# Patient Record
Sex: Male | Born: 1955 | Race: White | Hispanic: No | Marital: Married | Smoking: Current some day smoker
Health system: Southern US, Community
[De-identification: ages and names within clinical notes are randomized; demographics above are authoritative.]

## PROBLEM LIST (undated history)

## (undated) HISTORY — PX: BACK SURGERY: SHX140

## (undated) HISTORY — PX: TONSILLECTOMY: SUR1361

## (undated) HISTORY — PX: FRACTURE SURGERY: SHX138

## (undated) HISTORY — PX: APPENDECTOMY: SHX54

---

## 2000-08-26 ENCOUNTER — Inpatient Hospital Stay (HOSPITAL_COMMUNITY): Admission: EM | Admit: 2000-08-26 | Discharge: 2000-08-29 | Payer: Self-pay | Admitting: Internal Medicine

## 2000-08-26 ENCOUNTER — Encounter: Payer: Self-pay | Admitting: Internal Medicine

## 2000-08-27 ENCOUNTER — Encounter: Payer: Self-pay | Admitting: Orthopaedic Surgery

## 2001-01-25 ENCOUNTER — Ambulatory Visit (HOSPITAL_BASED_OUTPATIENT_CLINIC_OR_DEPARTMENT_OTHER): Admission: RE | Admit: 2001-01-25 | Discharge: 2001-01-25 | Payer: Self-pay | Admitting: Orthopaedic Surgery

## 2013-04-17 ENCOUNTER — Other Ambulatory Visit: Payer: Self-pay | Admitting: Orthopaedic Surgery

## 2013-04-17 DIAGNOSIS — R52 Pain, unspecified: Secondary | ICD-10-CM

## 2013-04-24 ENCOUNTER — Ambulatory Visit
Admission: RE | Admit: 2013-04-24 | Discharge: 2013-04-24 | Disposition: A | Discharge status: Home / Self Care | Payer: BC Managed Care – PPO | Source: Ambulatory Visit | Attending: Orthopaedic Surgery | Admitting: Orthopaedic Surgery

## 2013-04-24 DIAGNOSIS — R52 Pain, unspecified: Secondary | ICD-10-CM

## 2014-04-01 IMAGING — CT CT ANKLE*R* W/O CM
2 of 4 series · 5 of 14 positions shown, 6 images · IV contrast (agent unspecified)
Comparison: None.

***ADDENDUM*** CREATED: 06/25/2013 [DATE]

Original report incorrectly states CT of the ankle with contrast.
CT OF THE RIGHT ANKLE WITHOUT CONTRAST
TECHNIQUE: Multidetector CT imaging was performed following the
standard protocol without administration of intravenous contrast.
***END ADDENDUM*** SIGNED BY: Samsonidze Bdoyan, M.D.
CLINICAL DATA: Ankle pain following ORIF of the distal fibula.
CT OF THE RIGHT ANKLE WITH CONTRAST
standard protocol during bolus administration of intravenous
contrast.

[Series 2: ankle/foot bone · axial · 0.29mm/px · z∈[+15,+125]mm · 3 of 88 slices shown, 4 images]
[im 22/88  soft-tissue]
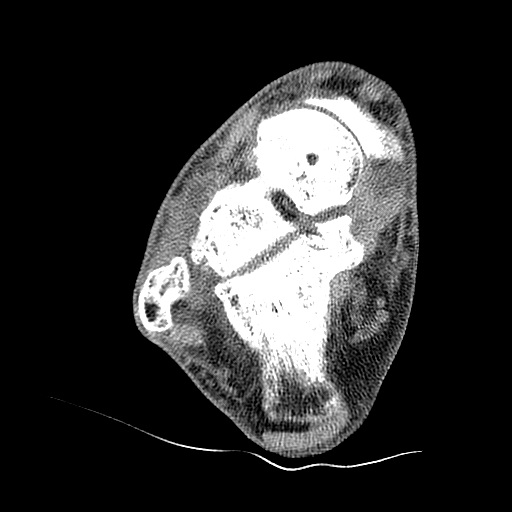
[im 22/88  bone]
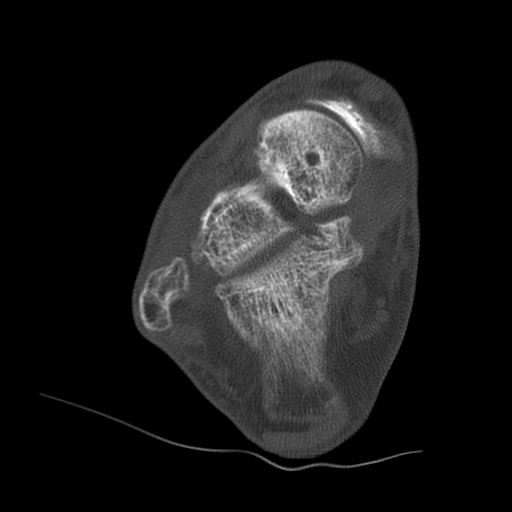
[im 44/88  bone]
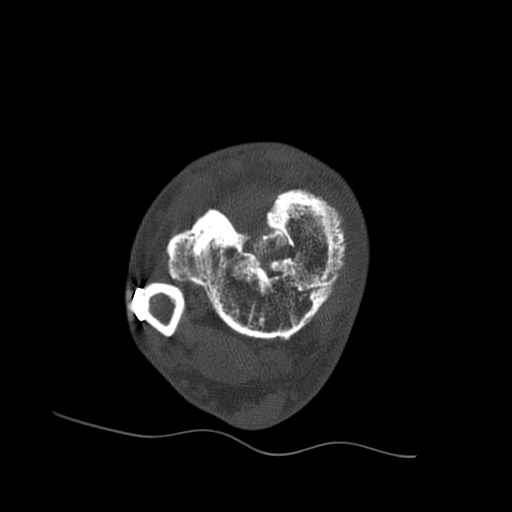
[im 66/88  bone]
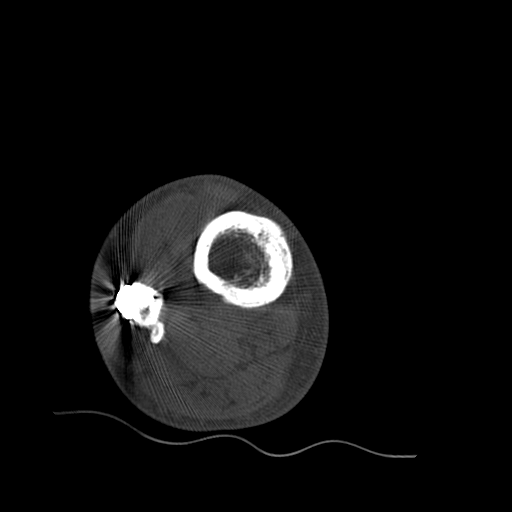

[Series 3: ankle /foot detail · axial · 0.29mm/px · z∈[+35,+108]mm · 2 of 88 slices shown]
[im 30/88  bone]
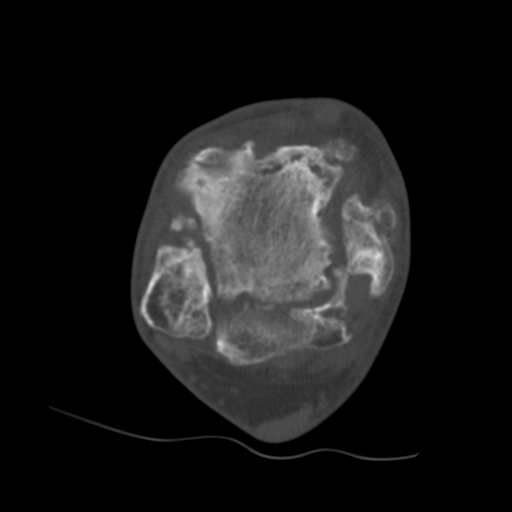
[im 59/88  bone]
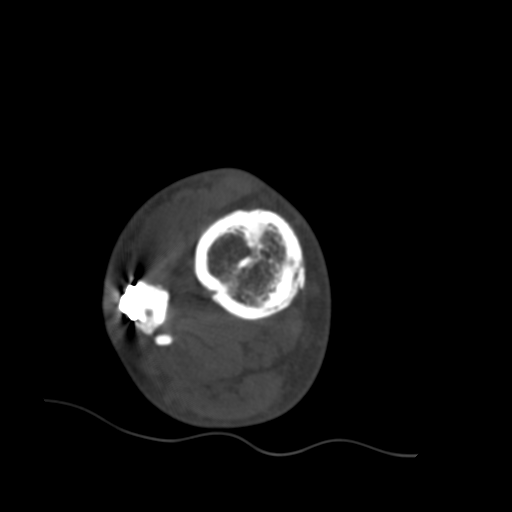

[5 of 14 positions shown; findings below may reference images not displayed]

FINDINGS: There is malunion of the Staden fracture of the distal
tibia.  The large loose bodies are present in the ankle joint.  The
ankle is severely degenerated, with large marginal osteophytes.
Large loose body or os trigonum present in the posterior joint
measuring 1 cm.  Loose bodies in the anterior joint also measure
about 1 cm.  There is a large cleft in the anterior tibial plafond
associated with malunion.  The distal fibular fracture is well
healed and intact.  There is no hardware complication identified.
There is no tibial hardware present.  Diffuse osteopenia is
present, likely disuse.  Pin tracks are present in the talus and
calcaneus.  Subchondral cysts are present throughout the talar
dome.  The subtalar joints are preserved, without spurring or
significant osteoarthritis.  Calcaneocuboid joint and midfoot
joints also appear within normal limits.

The peroneal tendons appear located.  Posteromedial tendons are
located.  There is some calcification and tunneling of the flexor
digitorum and posterior tibial tendons at the level of the medial
malleolus.  Calcification in the overlying retinaculum is
identified on image number 57.  The Achilles tendon appears intact.
Plantar fascia is within normal limits.
IMPRESSION: 1.  Yuniel fracture malunion with severe secondary osteoarthritis of
the ankle.  Multiple loose bodies are present measuring about 1 cm
each.  Sclerosis and subchondral cyst formation in the talar dome
appears degenerative rather than associated with AVN.
2.  Healed distal fibular fracture with intact lateral plate and
screw fixation.
3.  Normal appearance of the subtalar joints and midfoot joints.
4.  Partial tunneling of the posterior tibial tendon and flexor
digitorum longus tendons at the medial malleolus.

## 2016-03-24 ENCOUNTER — Encounter (HOSPITAL_COMMUNITY): Payer: Self-pay | Admitting: Emergency Medicine

## 2016-03-24 ENCOUNTER — Emergency Department (HOSPITAL_COMMUNITY): Payer: BLUE CROSS/BLUE SHIELD

## 2016-03-24 ENCOUNTER — Emergency Department (HOSPITAL_COMMUNITY)
Admission: EM | Admit: 2016-03-24 | Discharge: 2016-03-24 | Disposition: A | Discharge status: Home / Self Care | Payer: BLUE CROSS/BLUE SHIELD | Attending: Emergency Medicine | Admitting: Emergency Medicine

## 2016-03-24 DIAGNOSIS — F17210 Nicotine dependence, cigarettes, uncomplicated: Secondary | ICD-10-CM | POA: Diagnosis not present

## 2016-03-24 DIAGNOSIS — R509 Fever, unspecified: Secondary | ICD-10-CM

## 2016-03-24 DIAGNOSIS — R531 Weakness: Secondary | ICD-10-CM | POA: Diagnosis not present

## 2016-03-24 DIAGNOSIS — L03112 Cellulitis of left axilla: Secondary | ICD-10-CM | POA: Insufficient documentation

## 2016-03-24 LAB — CBC WITH DIFFERENTIAL/PLATELET
Basophils Absolute: 0 10*3/uL (ref 0.0–0.1)
Basophils Relative: 0 %
EOS ABS: 0 10*3/uL (ref 0.0–0.7)
Eosinophils Relative: 0 %
HCT: 43.1 % (ref 39.0–52.0)
HEMOGLOBIN: 14.8 g/dL (ref 13.0–17.0)
LYMPHS ABS: 1.2 10*3/uL (ref 0.7–4.0)
LYMPHS PCT: 15 %
MCH: 33 pg (ref 26.0–34.0)
MCHC: 34.3 g/dL (ref 30.0–36.0)
MCV: 96 fL (ref 78.0–100.0)
MONOS PCT: 7 %
Monocytes Absolute: 0.6 10*3/uL (ref 0.1–1.0)
NEUTROS PCT: 78 %
Neutro Abs: 6.1 10*3/uL (ref 1.7–7.7)
Platelets: 245 10*3/uL (ref 150–400)
RBC: 4.49 MIL/uL (ref 4.22–5.81)
RDW: 12.8 % (ref 11.5–15.5)
WBC: 8 10*3/uL (ref 4.0–10.5)

## 2016-03-24 LAB — URINALYSIS, ROUTINE W REFLEX MICROSCOPIC
Bilirubin Urine: NEGATIVE
GLUCOSE, UA: NEGATIVE mg/dL
HGB URINE DIPSTICK: NEGATIVE
Ketones, ur: 15 mg/dL — AB
LEUKOCYTES UA: NEGATIVE
Nitrite: NEGATIVE
PH: 6 (ref 5.0–8.0)
PROTEIN: NEGATIVE mg/dL
SPECIFIC GRAVITY, URINE: 1.021 (ref 1.005–1.030)

## 2016-03-24 LAB — I-STAT CG4 LACTIC ACID, ED: LACTIC ACID, VENOUS: 1.65 mmol/L (ref 0.5–2.0)

## 2016-03-24 LAB — BASIC METABOLIC PANEL
Anion gap: 10 (ref 5–15)
BUN: 14 mg/dL (ref 6–20)
CHLORIDE: 99 mmol/L — AB (ref 101–111)
CO2: 25 mmol/L (ref 22–32)
CREATININE: 1.07 mg/dL (ref 0.61–1.24)
Calcium: 9.1 mg/dL (ref 8.9–10.3)
GFR calc Af Amer: >60 mL/min (ref >60)
GFR calc non Af Amer: >60 mL/min (ref >60)
GLUCOSE: 116 mg/dL — AB (ref 65–99)
POTASSIUM: 4.6 mmol/L (ref 3.5–5.1)
SODIUM: 134 mmol/L — AB (ref 135–145)

## 2016-03-24 MED ORDER — ACETAMINOPHEN 325 MG PO TABS
650.0000 mg | ORAL_TABLET | Freq: Once | ORAL | Status: AC
  Administered 2016-03-24: 650 mg via ORAL
  Filled 2016-03-24: qty 2

## 2016-03-24 MED ORDER — SODIUM CHLORIDE 0.9 % IV BOLUS (SEPSIS)
1000.0000 mL | Freq: Once | INTRAVENOUS | Status: AC
  Administered 2016-03-24: 1000 mL via INTRAVENOUS

## 2016-03-24 MED ORDER — DEXTROSE 5 % IV SOLN
1.0000 g | Freq: Once | INTRAVENOUS | Status: AC
  Administered 2016-03-24: 1 g via INTRAVENOUS
  Filled 2016-03-24: qty 10

## 2016-03-24 MED ORDER — ONDANSETRON HCL 4 MG/2ML IJ SOLN
4.0000 mg | Freq: Once | INTRAMUSCULAR | Status: AC
  Administered 2016-03-24: 4 mg via INTRAVENOUS
  Filled 2016-03-24: qty 2

## 2016-03-24 MED ORDER — ONDANSETRON HCL 4 MG/2ML IJ SOLN
4.0000 mg | Freq: Once | INTRAMUSCULAR | Status: DC
  Filled 2016-03-24: qty 2

## 2016-03-24 MED ORDER — KETOROLAC TROMETHAMINE 30 MG/ML IJ SOLN
30.0000 mg | Freq: Once | INTRAMUSCULAR | Status: AC
Start: 2016-03-24 — End: 2016-03-24
  Administered 2016-03-24: 30 mg via INTRAVENOUS
  Filled 2016-03-24: qty 1

## 2016-03-24 MED ORDER — DOXYCYCLINE HYCLATE 100 MG PO TABS
100.0000 mg | ORAL_TABLET | Freq: Once | ORAL | Status: AC
  Administered 2016-03-24: 100 mg via ORAL
  Filled 2016-03-24: qty 1

## 2016-03-24 MED ORDER — DOXYCYCLINE HYCLATE 100 MG PO CAPS: 100.0000 mg | ORAL_CAPSULE | Freq: Two times a day (BID) | ORAL | Status: AC

## 2016-03-24 MED ORDER — CEFAZOLIN SODIUM-DEXTROSE 2-4 GM/100ML-% IV SOLN
2.0000 g | Freq: Once | INTRAVENOUS | Status: DC
Start: 2016-03-24 — End: 2016-03-24
  Filled 2016-03-24: qty 100

## 2016-03-24 MED ORDER — MORPHINE SULFATE (PF) 4 MG/ML IV SOLN
4.0000 mg | Freq: Once | INTRAVENOUS | Status: AC
  Administered 2016-03-24: 4 mg via INTRAVENOUS
  Filled 2016-03-24: qty 1

## 2016-03-24 MED ORDER — HYDROCODONE-ACETAMINOPHEN 5-325 MG PO TABS
1.0000 | ORAL_TABLET | Freq: Once | ORAL | Status: AC
  Administered 2016-03-24: 1 via ORAL
  Filled 2016-03-24: qty 1

## 2016-03-24 MED ORDER — MORPHINE SULFATE (PF) 4 MG/ML IV SOLN
4.0000 mg | INTRAVENOUS | Status: DC | PRN
Start: 2016-03-24 — End: 2016-03-24
  Administered 2016-03-24: 4 mg via INTRAVENOUS
  Filled 2016-03-24 (×2): qty 1

## 2016-03-24 MED ORDER — HYDROCODONE-ACETAMINOPHEN 5-325 MG PO TABS: 2.0000 | ORAL_TABLET | ORAL | Status: AC | PRN

## 2016-03-24 NOTE — Discharge Instructions (Signed)
Doxycycline can cause skin sensitivity. Use sunscreen liberally. Return to ER with any new or worsening symptoms.   Cellulitis Cellulitis is an infection of the skin and the tissue under the skin. The infected area is usually red and tender. This happens most often in the arms and lower legs. HOME CARE   Take your antibiotic medicine as told. Finish the medicine even if you start to feel better.  Keep the infected arm or leg raised (elevated).  Put a warm cloth on the area up to 4 times per day.  Only take medicines as told by your doctor.  Keep all doctor visits as told. GET HELP IF:  You see red streaks on the skin coming from the infected area.  Your red area gets bigger or turns a dark color.  Your bone or joint under the infected area is painful after the skin heals.  Your infection comes back in the same area or different area.  You have a puffy (swollen) bump in the infected area.  You have new symptoms.  You have a fever. GET HELP RIGHT AWAY IF:   You feel very sleepy.  You throw up (vomit) or have watery poop (diarrhea).  You feel sick and have muscle aches and pains.   This information is not intended to replace advice given to you by your health care provider. Make sure you discuss any questions you have with your health care provider.   Document Released: 03/14/2008 Document Revised: 06/17/2015 Document Reviewed: 12/12/2011 Elsevier Interactive Patient Education 2016 Elsevier Inc.  Fever, Adult A fever is an increase in the body's temperature. It is often defined as a temperature of 100 F (38C) or higher. Short mild or moderate fevers often have no long-term effects. They also often do not need treatment. Moderate or high fevers may make you feel uncomfortable. Sometimes, they can also be a sign of a serious illness or disease. The sweating that may happen with repeated fevers or fevers that last a while may also cause you to not have enough fluid in your  body (dehydration). You can take your temperature with a thermometer to see if you have a fever. A measured temperature can change with:  Age.  Time of day.  Where the thermometer is placed:  Mouth (oral).  Rectum (rectal).  Ear (tympanic).  Underarm (axillary).  Forehead (temporal). HOME CARE Pay attention to any changes in your symptoms. Take these actions to help with your condition:  Take over-the-counter and prescription medicines only as told by your doctor. Follow the dosing instructions carefully.  If you were prescribed an antibiotic medicine, take it as told by your doctor. Do not stop taking the antibiotic even if you start to feel better.  Rest as needed.  Drink enough fluid to keep your pee (urine) clear or pale yellow.  Sponge yourself or bathe with room-temperature water as needed. This helps to lower your body temperature . Do not use ice water.  Do not wear too many blankets or heavy clothes. GET HELP IF:  You throw up (vomit).  You cannot eat or drink without throwing up.  You have watery poop (diarrhea).  It hurts when you pee.  Your symptoms do not get better with treatment.  You have new symptoms.  You feel very weak. GET HELP RIGHT AWAY IF:  You are short of breath or have trouble breathing.  You are dizzy or you pass out (faint).  You feel confused.  You have signs of not  having enough fluid in your body, such as:  A dry mouth.  Peeing less.  Looking pale.  You have very bad pain in your belly (abdomen).  You keep throwing up or having water poop.  You have a skin rash.  Your symptoms suddenly get worse.   This information is not intended to replace advice given to you by your health care provider. Make sure you discuss any questions you have with your health care provider.   Document Released: 07/05/2008 Document Revised: 06/17/2015 Document Reviewed: 11/20/2014 Elsevier Interactive Patient Education [employer]2016 Elsevier  Inc.

## 2016-03-24 NOTE — ED Notes (Signed)
MD at bedside. 

## 2016-03-24 NOTE — ED Notes (Signed)
PT DISCHARGED. INSTRUCTIONS AND PRESCRIPTIONS GIVEN. AAOX4. PT IN NO APPARENT DISTRESS. THE OPPORTUNITY TO ASK QUESTIONS WAS PROVIDED. 

## 2016-03-24 NOTE — ED Notes (Signed)
Nurse at bedside collecting labs 

## 2016-03-24 NOTE — Progress Notes (Signed)
[facility] ED CM [facility] & noted pt with coverage but no pcp listed [facility] ED CM spoke with pt on how to obtain an in network pcp with insurance coverage via the customer service number or web site  Cm reviewed ED level of care for crisis/emergent services and community pcp level of care to manage continuous or chronic medical concerns.  The pt voiced understanding CM encouraged pt and discussed pt's responsibility to verify with pt's insurance carrier that any recommended medical provider offered by any emergency room or a hospital provider is within the carrier's network. The pt voiced understanding  Pt also inquired about paying ED to meet his deductible Cm informed him if ED registration did not ask for a co pay he would be billed and then could pay the bill to meet his deductible

## 2016-03-24 NOTE — ED Notes (Signed)
Patient sent from urgent care for fever and possible insect bite.  Patient states that symptoms started late Monday night.  He has chills ad sweats and hasn't been able to sleep in 3 days. Patient states was in his shed and believes that is where he got bit by spider.

## 2016-03-24 NOTE — ED Notes (Signed)
Patient transported to X-ray 

## 2016-03-24 NOTE — ED Provider Notes (Signed)
CSN: [id number]     Arrival date & time 03/24/16  [id number] History   First MD Initiated Contact with Patient 03/24/16 [phone]     Chief Complaint  Patient presents with  . Fever  . Chills  . Insect Bite     HPI  Patient presents for evaluation of fever headache and a rash.  3 days ago he was in a shed preparing some fishing equipment for a planned trip to [place]. He does not recall any particular bites. No visualized spiders. No ticks found on his person, that day, or anytime recently.  Later that evening he developed fever shakes and chills. The next day he noticed a small area of redness on his left upper lateral chest that has progressed and gotten larger in size that is red and itches. He has had fever shakes and chills. Takes aspirin at home. He is "having episodes of sweating". He went to primary care urgent care today and was referred here.  He has a headache. Does not have neck pain or stiffness. No sore throat or pharyngitis. No ear pain or sinus symptoms. Again no rigidity to the neck. No cough or difficult to breathing. No GI complaints or pain. No joint pain. Area of erythema approximately 6 cm diameter left lateral chest  History reviewed. No pertinent past medical history. Past Surgical History  Procedure Laterality Date  . Appendectomy    . Tonsillectomy    . Fracture surgery    . Back surgery     No family history on file. Social History  Substance Use Topics  . Smoking status: Current Every Day Smoker    Types: Cigarettes  . Smokeless tobacco: None  . Alcohol Use: Yes    Review of Systems  Constitutional: Positive for fever, chills and diaphoresis. Negative for appetite change and fatigue.  HENT: Negative for mouth sores, sore throat and trouble swallowing.   Eyes: Negative for visual disturbance.  Respiratory: Negative for cough, chest tightness, shortness of breath and wheezing.   Cardiovascular: Negative for chest pain.  Gastrointestinal:  Negative for nausea, vomiting, abdominal pain, diarrhea and abdominal distention.  Endocrine: Negative for polydipsia, polyphagia and polyuria.  Genitourinary: Negative for dysuria, frequency and hematuria.  Musculoskeletal: Negative for gait problem.  Skin: Positive for rash. Negative for color change and pallor.  Neurological: Positive for weakness and headaches. Negative for dizziness, syncope and light-headedness.  Hematological: Does not bruise/bleed easily.  Psychiatric/Behavioral: Negative for behavioral problems and confusion.      Allergies  Review of patient's allergies indicates no known allergies.  Home Medications   Prior to Admission medications   Medication Sig Start Date End Date Taking? Authorizing Provider  aspirin EC 81 MG tablet Take 81 mg by mouth daily.   Yes Historical Provider, MD  Multiple Vitamin (MULTIVITAMIN WITH MINERALS) TABS tablet Take 1 tablet by mouth daily.   Yes Historical Provider, MD  doxycycline (VIBRAMYCIN) 100 MG capsule Take 1 capsule (100 mg total) by mouth 2 (two) times daily. 03/24/16   Rolland PorterMark Aliene Tamura, MD  HYDROcodone-acetaminophen (NORCO/VICODIN) 5-325 MG tablet Take 2 tablets by mouth every 4 (four) hours as needed. 03/24/16   Rolland PorterMark Dewain Platz, MD   BP 137/79 mmHg  Pulse 90  Temp(Src) 100.3 F (37.9 C) (Oral)  Resp 15  SpO2 100% Physical Exam  Constitutional: He is oriented to person, place, and time. He appears well-developed and well-nourished. No distress.  Awake and alert. Mentating well. Does not appear distressed.  HENT:  Head: Normocephalic.  Eyes: Conjunctivae are normal. Pupils are equal, round, and reactive to light. No scleral icterus.  Neck: Normal range of motion. Neck supple. No thyromegaly present.  Cardiovascular: Normal rate and regular rhythm.  Exam reveals no gallop and no friction rub.   No murmur heard. Pulmonary/Chest: Effort normal and breath sounds normal. No respiratory distress. He has no wheezes. He has no rales.     Clear bilateral breath sounds.  Abdominal: Soft. Bowel sounds are normal. He exhibits no distension. There is no tenderness. There is no rebound.  Musculoskeletal: Normal range of motion.  Neurological: He is alert and oriented to person, place, and time.  Skin: Skin is warm and dry. No rash noted.  Psychiatric: He has a normal mood and affect. His behavior is normal.    ED Course  Procedures (including critical care time) Labs Review Labs Reviewed  BASIC METABOLIC PANEL - Abnormal; Notable for the following:    Sodium 134 (*)    Chloride 99 (*)    Glucose, Bld 116 (*)    All other components within normal limits  URINALYSIS, ROUTINE W REFLEX MICROSCOPIC (NOT AT [facility]) - Abnormal; Notable for the following:    Ketones, ur 15 (*)    All other components within normal limits  CULTURE, BLOOD (ROUTINE X 2)  CULTURE, BLOOD (ROUTINE X 2)  CBC WITH DIFFERENTIAL/PLATELET  B. BURGDORFI ANTIBODIES  LYME DISEASE DNA BY PCR(BORRELIA BURG)  I-STAT CG4 LACTIC ACID, ED    Imaging Review Dg Chest 2 View  03/24/2016  CLINICAL DATA:  Spider bite on Tuesday, on Wednesday developed high fever, headache, and nausea which continues EXAM: CHEST  2 VIEW COMPARISON:  None FINDINGS: Normal heart size, mediastinal contours, and pulmonary vascularity. Lungs clear. No pleural effusion or pneumothorax. Mild scattered degenerative disc disease changes mid thoracic spine. IMPRESSION: No acute abnormalities. Electronically Signed   By: Ulyses Southward M.D.   On: 03/24/2016 11:16   I have personally reviewed and evaluated these images and lab results as part of my medical decision-making.   EKG Interpretation None      MDM   Final diagnoses:  Fever, unspecified fever cause  Cellulitis of left axilla    Patient does not have rash with stigmata of erythema chronicum migrans/limes. Does not have palmar rashes suggests rickettsia. May be simple cellulitis. Given IV Rocephin. We'll discharge with  doxycycline. Cautioned against the sun sensitivity effects of doxycycline. Return to ER with any new or worsening symptoms.    Rolland Porter, MD 03/24/16 1314

## 2016-03-25 LAB — LYME DISEASE DNA BY PCR(BORRELIA BURG): LYME DISEASE(B. BURGDORFERI) PCR: POSITIVE — AB

## 2016-03-25 LAB — B. BURGDORFI ANTIBODIES: B burgdorferi Ab IgG+IgM: <0.91 {ISR} (ref 0.00–0.90)

## 2016-03-29 LAB — CULTURE, BLOOD (ROUTINE X 2)
CULTURE: NO GROWTH
CULTURE: NO GROWTH

## 2018-05-21 ENCOUNTER — Encounter (HOSPITAL_COMMUNITY): Payer: Self-pay | Admitting: *Deleted

## 2018-05-21 ENCOUNTER — Emergency Department (HOSPITAL_COMMUNITY)
Admission: EM | Admit: 2018-05-21 | Discharge: 2018-05-21 | Disposition: A | Discharge status: Home / Self Care | Payer: BLUE CROSS/BLUE SHIELD | Attending: Emergency Medicine | Admitting: Emergency Medicine

## 2018-05-21 ENCOUNTER — Other Ambulatory Visit: Payer: Self-pay

## 2018-05-21 ENCOUNTER — Emergency Department (HOSPITAL_COMMUNITY): Payer: BLUE CROSS/BLUE SHIELD

## 2018-05-21 DIAGNOSIS — M79641 Pain in right hand: Secondary | ICD-10-CM | POA: Diagnosis present

## 2018-05-21 DIAGNOSIS — F17210 Nicotine dependence, cigarettes, uncomplicated: Secondary | ICD-10-CM | POA: Insufficient documentation

## 2018-05-21 MED ORDER — AMOXICILLIN-POT CLAVULANATE 875-125 MG PO TABS: 1.0000 | ORAL_TABLET | Freq: Two times a day (BID) | ORAL | 0 refills | Status: AC

## 2018-05-21 NOTE — Discharge Instructions (Signed)
You can take Tylenol or Ibuprofen as directed for pain. You can alternate Tylenol and Ibuprofen every 4 hours. If you take Tylenol at 1pm, then you can take Ibuprofen at 5pm. Then you can take Tylenol again at 9pm.   Continue elevating and applying ice.  Take antibiotics as directed. Please take all of your antibiotics until finished.  He will follow-up with Dr. Ronie SpiesWeingold's office on 05/24/2018.  His office will contact you with a specific time.  Return emergency department for any fevers, worsening redness or swelling or any other worsening or concerning symptoms.

## 2018-05-21 NOTE — ED Notes (Signed)
Pt updated on plan of care by PA

## 2018-05-21 NOTE — ED Notes (Signed)
Pt updated on plan of care

## 2018-05-21 NOTE — ED Provider Notes (Signed)
[facility]-EMERGENCY DEPT Provider Note   CSN: [id number] Arrival date & time: 05/21/18  1328     History   Chief Complaint Chief Complaint  Patient presents with  . Hand Pain    right index finger    HPI Sergio Escobar is a 62 y.o. male who presents for evaluation of right index finger x4 days.  Patient states that prior to onset of symptoms, he was out working in the garden.  He did have gloves on.  Denies any trauma, injury, fall.  He states he woke up the next morning and the finger was swollen and painful.  He reports more pain when trying to flex the finger.  He also noticed that swelling and redness started extending toward the MCP joint.  Patient reports he has tried elevating, icing the finger with minimal improvement.  Patient states he was also concerned about some bruising to the lateral aspect of the finger.  He states that several years ago, he had a foreign body that was lodged in the finger and he never had it removed.  Patient denies any other trauma, injury, fall.  Patient denies any numbness/weakness, fevers.  The history is provided by the patient.    History reviewed. No pertinent past medical history.  There are no active problems to display for this patient.   Past Surgical History:  Procedure Laterality Date  . APPENDECTOMY    . BACK SURGERY    . FRACTURE SURGERY    . TONSILLECTOMY          Home Medications    Prior to Admission medications   Medication Sig Start Date End Date Taking? Authorizing Provider  amoxicillin-clavulanate (AUGMENTIN) 875-125 MG tablet Take 1 tablet by mouth every 12 (twelve) hours. 05/21/18   Maxwell CaulLayden, Sherrod Toothman A, PA-C  aspirin EC 81 MG tablet Take 81 mg by mouth daily.    [provider]  doxycycline (VIBRAMYCIN) 100 MG capsule Take 1 capsule (100 mg total) by mouth 2 (two) times daily. 03/24/16   Rolland PorterJames, Mark, MD  HYDROcodone-acetaminophen (NORCO/VICODIN) 5-325 MG tablet Take 2 tablets by mouth  every 4 (four) hours as needed. 03/24/16   Rolland PorterJames, Mark, MD  Multiple Vitamin (MULTIVITAMIN WITH MINERALS) TABS tablet Take 1 tablet by mouth daily.    [provider]    Family History No family history on file.  Social History Social History   Tobacco Use  . Smoking status: Current Some Day Smoker    Types: Cigarettes  . Smokeless tobacco: Never Used  Substance Use Topics  . Alcohol use: Yes    Alcohol/week: 6.0 standard drinks    Types: 6 Standard drinks or equivalent per week  . Drug use: Yes    Types: Marijuana     Allergies   Oxycodone   Review of Systems Review of Systems  Constitutional: Negative for fever.  Musculoskeletal:       Right index finger pain  Skin: Positive for color change.     Physical Exam Updated Vital Signs BP (!) 144/89 (BP Location: Right Arm)   Pulse (!) 55   Temp 98.7 F (37.1 C) (Oral)   Resp 18   Ht 6' (1.829 m)   Wt 76.8 kg   SpO2 100%   BMI 22.97 kg/m   Physical Exam  Constitutional: He appears well-developed and well-nourished.  HENT:  Head: Normocephalic and atraumatic.  Eyes: Conjunctivae and EOM are normal. Right eye exhibits no discharge. Left eye exhibits no discharge. No  scleral icterus.  Cardiovascular:  Pulses:      Radial pulses are 2+ on the right side, and 2+ on the left side.  Pulmonary/Chest: Effort normal.  Musculoskeletal:  Tenderness to palpation noted to the lateral aspect of the right index finger that extends around to the PIP.  There is some overlying edema, erythema from the PIP that extends distally toward the MCP.  Palpable foreign body noted to the lateral aspect of the finger.  There is some ecchymosis noted to the lateral aspect.  No tenderness noted along the flexor tendon sheath.  Extension intact without any difficulty.  Patient does report some pain with flexion of the digit.  Neurological: He is alert.  Skin: Skin is warm and dry. Capillary refill takes less than 2 seconds.  Good  distal cap refill. RUE is not dusky in appearance or cool to touch.  No open sores, lesions, lacerations.  Psychiatric: He has a normal mood and affect. His speech is normal and behavior is normal.  Nursing note and vitals reviewed.          ED Treatments / Results  Labs (all labs ordered are listed, but only abnormal results are displayed) Labs Reviewed - No data to display  EKG None  Radiology Dg Hand Complete Right  Result Date: 05/21/2018 CLINICAL DATA:  Swelling of the right index finger. Patient has known magnetic object in the right index finger. EXAM: RIGHT HAND - COMPLETE 3+ VIEW COMPARISON:  None. FINDINGS: There is no evidence of fracture or dislocation. Degenerative joint changes of the carpal bones are identified. Radiopaque foreign bodies identified in the soft tissues of the index thickening are consistent with patient's known history. IMPRESSION: No acute abnormality noted. Electronically Signed   By: Sherian Rein M.D.   On: 05/21/2018 14:11    Procedures Procedures (including critical care time)  Medications Ordered in ED Medications - No data to display   Initial Impression / Assessment and Plan / ED Course  I have reviewed the triage vital signs and the nursing notes.  Pertinent labs & imaging results that were available during my care of the patient were reviewed by me and considered in my medical decision making (see chart for details).     62 year old male who presents for evaluation of right index finger pain, redness, swelling that has been ongoing for the last 3 to 4 days.  He does report that prior to onset of symptoms, he had been working in his garden.  Does not recall specific trauma, injury, fall.  He does report that he has had a metal foreign body noted in the finger for several years.  He is worried that this may have shifted in the fingers causing injury.  No fevers, numbness/weakness. Patient is afebrile, non-toxic appearing, sitting  comfortably on examination table. Vital signs reviewed and stable. Patient is neurovascularly intact.  Patient with tenderness palpation noted to the distal aspect of the finger that extends proximally over the MCP joint.  He also has some overlying edema and erythema.  Extension is intact but he has limited flexion secondary to pain.  No overlying warmth.  History/physical exam is not concerning for flexor tenosynovitis.  No obvious open sore, lesion.  X-ray ordered at triage.  X-ray reviewed.  Negative for any acute fracture dislocation.  There is evidence of a metal foreign body noted.  No other acute abnormality.  Discussed patient with Dr. Madilyn Hook after independent evaluation.  Question if this is infection or inflammatory response  from metallic foreign body.  We will plan to consult hand for further evaluation.  Discussed with Dr. Ronie SpiesWeingold's nurse, Sergio Escobar.  She discussed with Dr. Andi Escobar regarding case and he looked over patient's chart.  Recommend starting patient on Augmentin.  He will plan to see patient as outpatient office on 05/24/2018.  Updated patient on plan.  He is agreeable. Patient had ample opportunity for questions and discussion. All patient's questions were answered with full understanding. Strict return precautions discussed. Patient expresses understanding and agreement to plan.   Final Clinical Impressions(s) / ED Diagnoses   Final diagnoses:  Pain of right hand    ED Discharge Orders         Ordered    amoxicillin-clavulanate (AUGMENTIN) 875-125 MG tablet  Every 12 hours     05/21/18 1623           Rosana HoesLayden, Latorria Zeoli A, PA-C 05/21/18 1630    Tilden Fossaees, Elizabeth, MD 05/23/18 1342

## 2018-05-21 NOTE — ED Triage Notes (Signed)
Pt states he was pulling poison ivy vines in his yard on Thursday and then pt woke up on Friday, pt right index finger was swollen.  The pain and swelling has gotten worse despite pt's effort to ice the finger, elevate it, and compress it. Pt a/o x 4 and ambulatory. Pt denies any other new symptoms.

## 2019-04-28 IMAGING — CR DG HAND COMPLETE 3+V*R*
3 series · 3 of 3 positions shown · non-contrast
Comparison: None.

CLINICAL DATA: Swelling of the right index finger. Patient has
known magnetic object in the right index finger.

EXAM:
RIGHT HAND - COMPLETE 3+ VIEW

[x hand pa right]
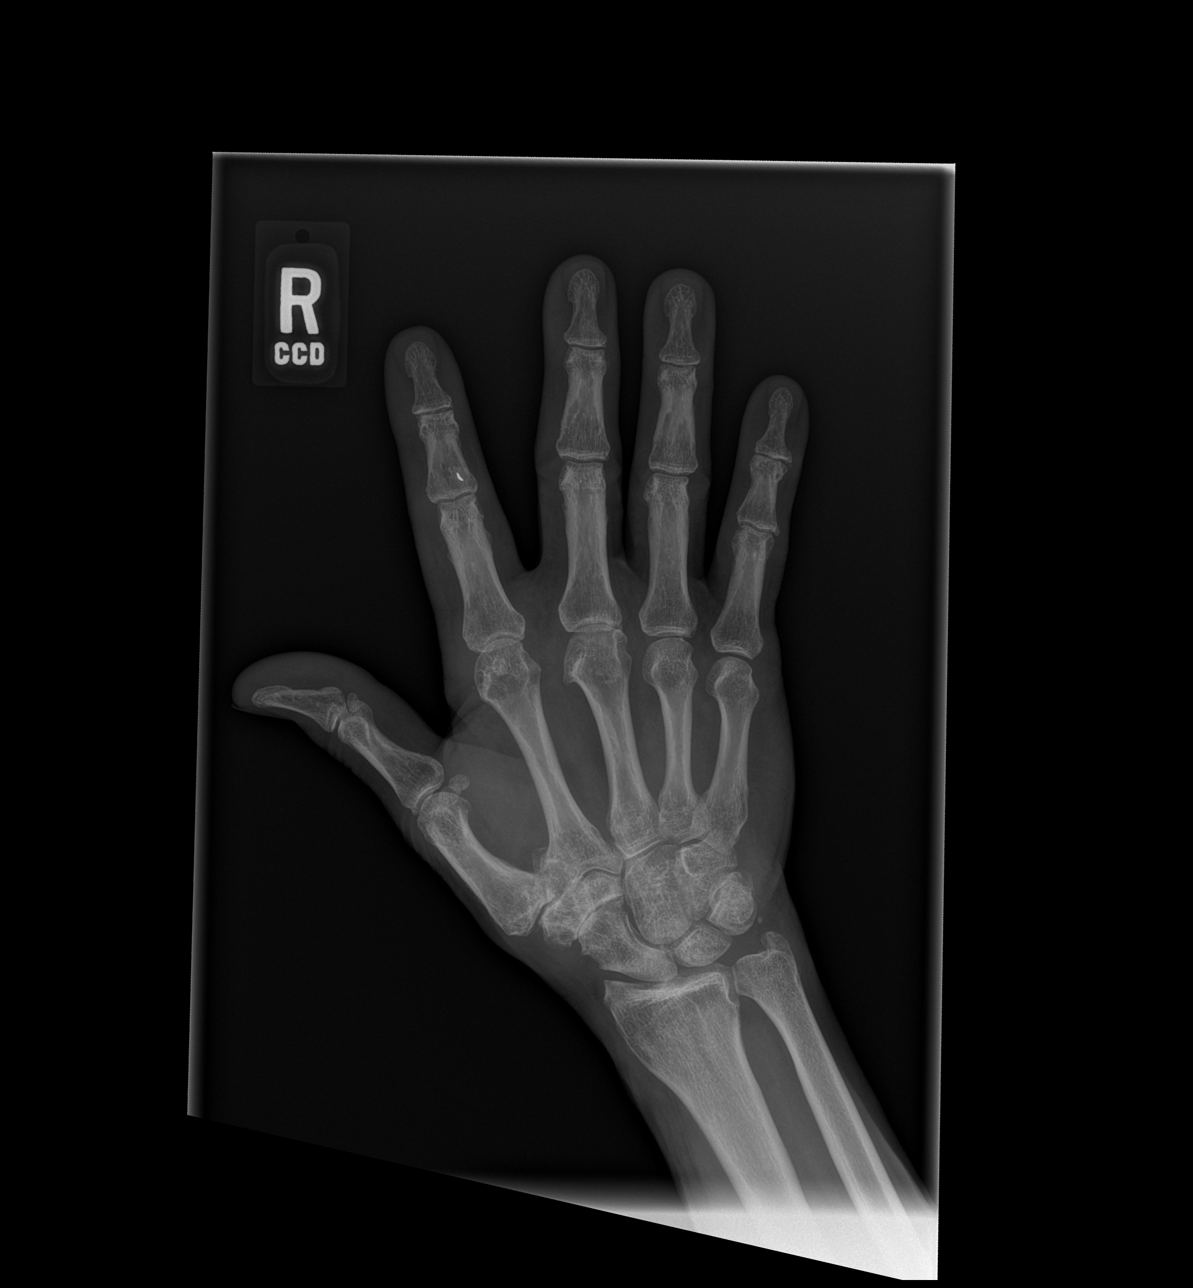

[x hand obl right]
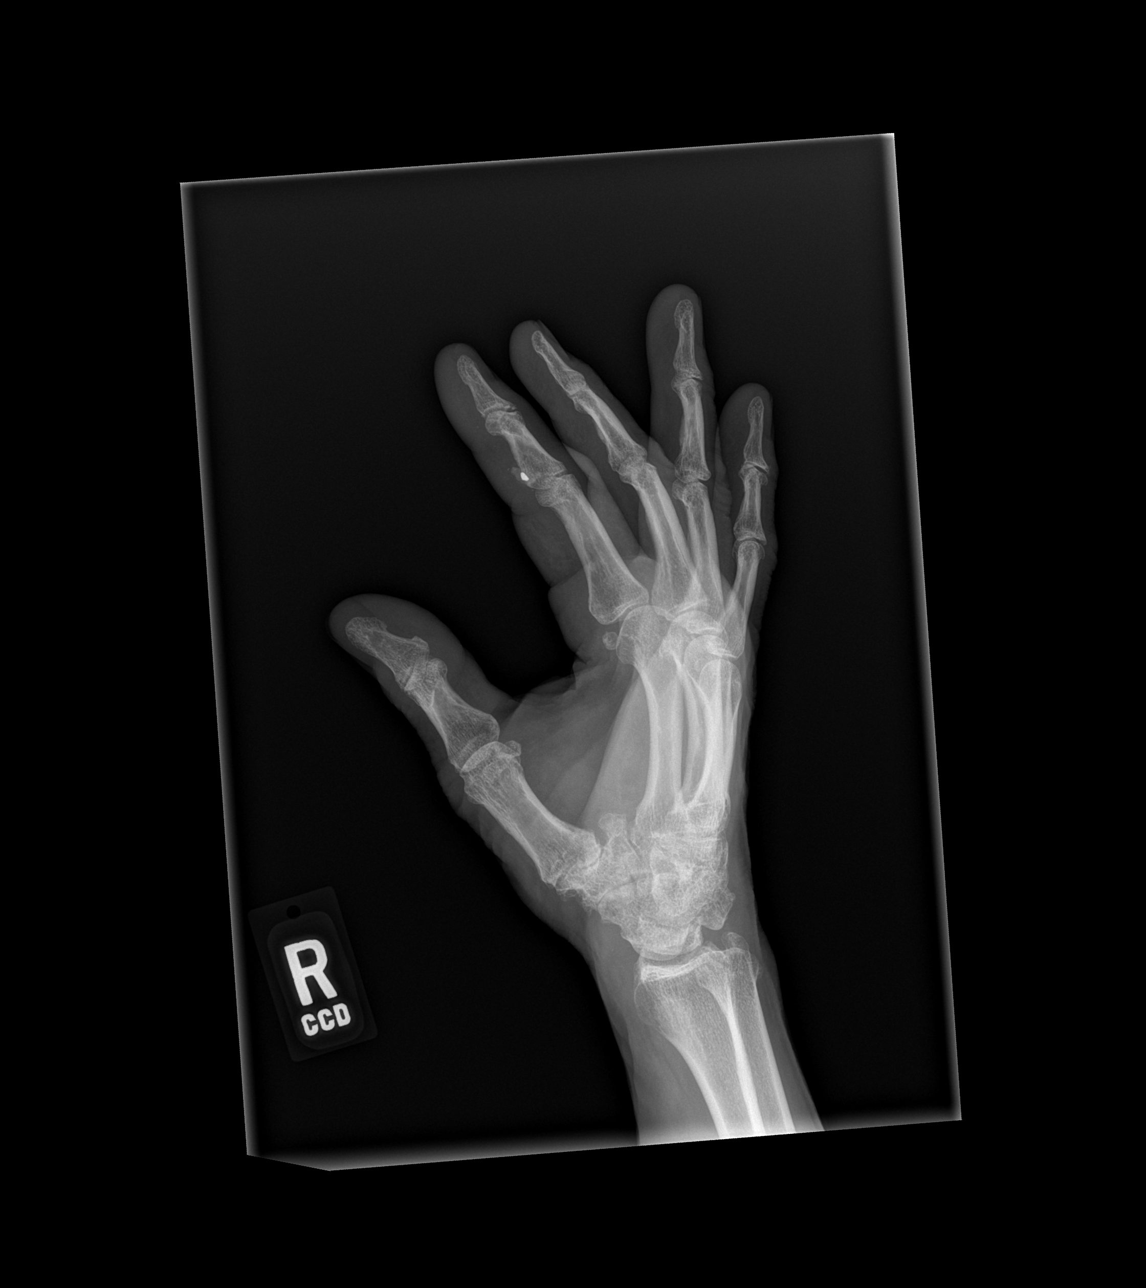

[x hand lat right]
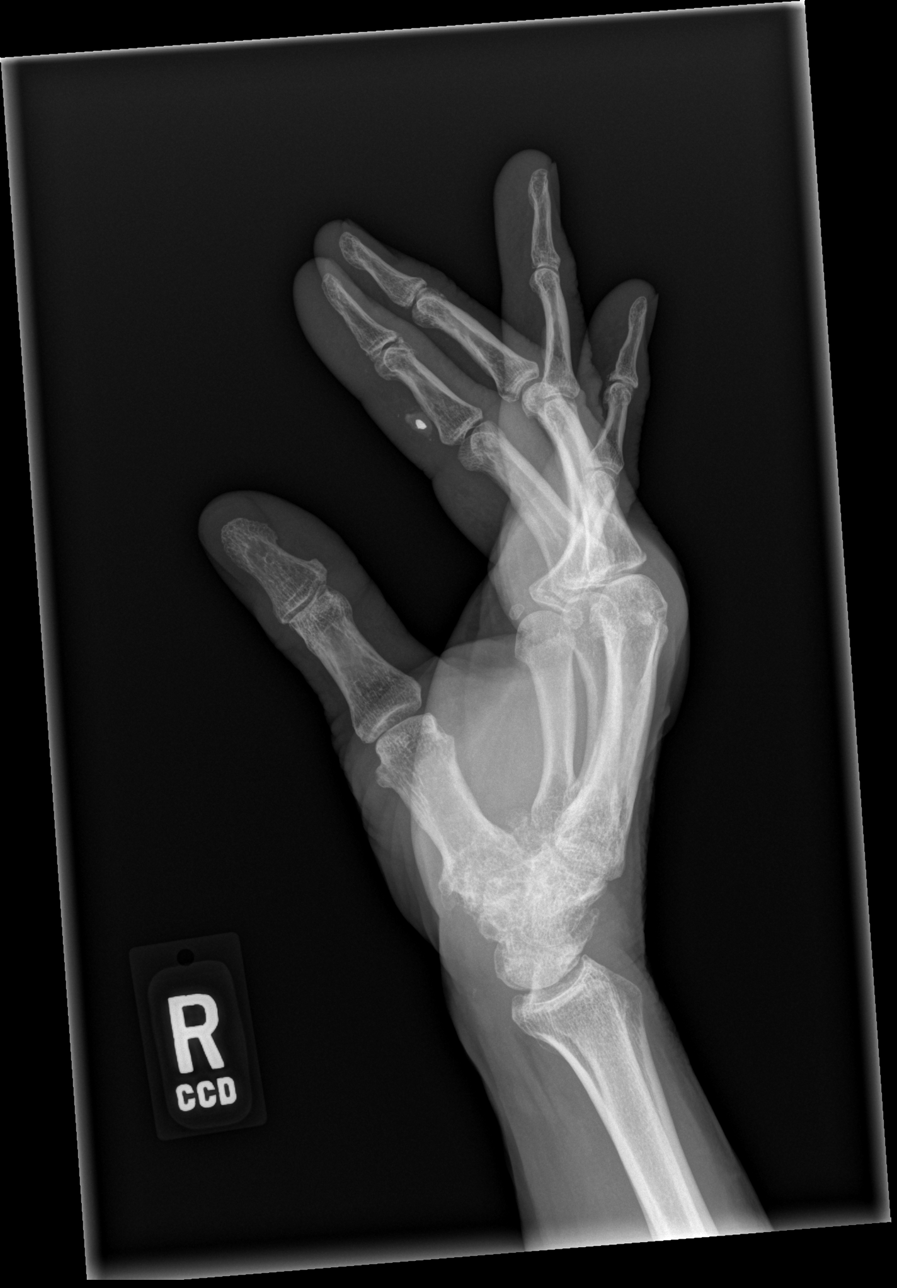

[3 of 3 positions shown; findings below may reference images not displayed]

FINDINGS: There is no evidence of fracture or dislocation. Degenerative joint
changes of the carpal bones are identified. Radiopaque foreign
bodies identified in the soft tissues of the index thickening are
consistent with patient's known history.
IMPRESSION: No acute abnormality noted.

## 2023-02-23 DIAGNOSIS — K047 Periapical abscess without sinus: Secondary | ICD-10-CM | POA: Diagnosis not present

## 2023-08-22 ENCOUNTER — Other Ambulatory Visit: Payer: Self-pay | Admitting: Internal Medicine

## 2023-08-22 ENCOUNTER — Ambulatory Visit
Admission: RE | Admit: 2023-08-22 | Discharge: 2023-08-22 | Disposition: A | Discharge status: Home / Self Care | Payer: 59 | Source: Ambulatory Visit | Attending: Internal Medicine | Admitting: Internal Medicine

## 2023-08-22 DIAGNOSIS — R030 Elevated blood-pressure reading, without diagnosis of hypertension: Secondary | ICD-10-CM | POA: Diagnosis not present

## 2023-08-22 DIAGNOSIS — M25552 Pain in left hip: Secondary | ICD-10-CM | POA: Diagnosis not present

## 2023-08-22 DIAGNOSIS — M1610 Unilateral primary osteoarthritis, unspecified hip: Secondary | ICD-10-CM | POA: Diagnosis not present

## 2023-08-22 DIAGNOSIS — A6920 Lyme disease, unspecified: Secondary | ICD-10-CM | POA: Diagnosis not present

## 2023-08-22 DIAGNOSIS — Z79899 Other long term (current) drug therapy: Secondary | ICD-10-CM | POA: Diagnosis not present

## 2023-08-22 DIAGNOSIS — D7589 Other specified diseases of blood and blood-forming organs: Secondary | ICD-10-CM | POA: Diagnosis not present

## 2023-08-22 DIAGNOSIS — M1990 Unspecified osteoarthritis, unspecified site: Secondary | ICD-10-CM | POA: Diagnosis not present

## 2023-08-22 DIAGNOSIS — Z23 Encounter for immunization: Secondary | ICD-10-CM | POA: Diagnosis not present

## 2023-08-22 DIAGNOSIS — Z87891 Personal history of nicotine dependence: Secondary | ICD-10-CM | POA: Diagnosis not present

## 2023-09-05 DIAGNOSIS — Z23 Encounter for immunization: Secondary | ICD-10-CM | POA: Diagnosis not present

## 2023-09-05 DIAGNOSIS — E785 Hyperlipidemia, unspecified: Secondary | ICD-10-CM | POA: Diagnosis not present

## 2023-09-05 DIAGNOSIS — M25552 Pain in left hip: Secondary | ICD-10-CM | POA: Diagnosis not present

## 2023-09-05 DIAGNOSIS — I10 Essential (primary) hypertension: Secondary | ICD-10-CM | POA: Diagnosis not present

## 2023-09-05 DIAGNOSIS — D75839 Thrombocytosis, unspecified: Secondary | ICD-10-CM | POA: Diagnosis not present

## 2023-09-05 DIAGNOSIS — M25551 Pain in right hip: Secondary | ICD-10-CM | POA: Diagnosis not present

## 2023-09-05 DIAGNOSIS — E875 Hyperkalemia: Secondary | ICD-10-CM | POA: Diagnosis not present

## 2023-09-05 DIAGNOSIS — Z0000 Encounter for general adult medical examination without abnormal findings: Secondary | ICD-10-CM | POA: Diagnosis not present

## 2023-09-05 DIAGNOSIS — Z87891 Personal history of nicotine dependence: Secondary | ICD-10-CM | POA: Diagnosis not present

## 2023-09-05 DIAGNOSIS — M19049 Primary osteoarthritis, unspecified hand: Secondary | ICD-10-CM | POA: Diagnosis not present

## 2023-12-07 DIAGNOSIS — I10 Essential (primary) hypertension: Secondary | ICD-10-CM | POA: Diagnosis not present

## 2023-12-07 DIAGNOSIS — M1990 Unspecified osteoarthritis, unspecified site: Secondary | ICD-10-CM | POA: Diagnosis not present

## 2023-12-12 DIAGNOSIS — M1612 Unilateral primary osteoarthritis, left hip: Secondary | ICD-10-CM | POA: Diagnosis not present

## 2023-12-21 DIAGNOSIS — E875 Hyperkalemia: Secondary | ICD-10-CM | POA: Diagnosis not present

## 2023-12-21 DIAGNOSIS — I10 Essential (primary) hypertension: Secondary | ICD-10-CM | POA: Diagnosis not present

## 2023-12-21 DIAGNOSIS — R0600 Dyspnea, unspecified: Secondary | ICD-10-CM | POA: Diagnosis not present

## 2023-12-21 DIAGNOSIS — M169 Osteoarthritis of hip, unspecified: Secondary | ICD-10-CM | POA: Diagnosis not present

## 2023-12-21 DIAGNOSIS — Z720 Tobacco use: Secondary | ICD-10-CM | POA: Diagnosis not present

## 2023-12-21 DIAGNOSIS — E785 Hyperlipidemia, unspecified: Secondary | ICD-10-CM | POA: Diagnosis not present

## 2024-01-11 DIAGNOSIS — M25652 Stiffness of left hip, not elsewhere classified: Secondary | ICD-10-CM | POA: Diagnosis not present

## 2024-01-11 DIAGNOSIS — M1612 Unilateral primary osteoarthritis, left hip: Secondary | ICD-10-CM | POA: Diagnosis not present

## 2024-01-18 DIAGNOSIS — M1612 Unilateral primary osteoarthritis, left hip: Secondary | ICD-10-CM | POA: Diagnosis not present

## 2024-01-18 DIAGNOSIS — M25552 Pain in left hip: Secondary | ICD-10-CM | POA: Diagnosis not present

## 2024-02-02 DIAGNOSIS — Z96642 Presence of left artificial hip joint: Secondary | ICD-10-CM | POA: Diagnosis not present

## 2024-02-02 DIAGNOSIS — M1612 Unilateral primary osteoarthritis, left hip: Secondary | ICD-10-CM | POA: Diagnosis not present

## 2024-02-05 DIAGNOSIS — Z96642 Presence of left artificial hip joint: Secondary | ICD-10-CM | POA: Diagnosis not present

## 2024-02-05 DIAGNOSIS — R531 Weakness: Secondary | ICD-10-CM | POA: Diagnosis not present

## 2024-02-05 DIAGNOSIS — M25652 Stiffness of left hip, not elsewhere classified: Secondary | ICD-10-CM | POA: Diagnosis not present

## 2024-02-13 DIAGNOSIS — Z96642 Presence of left artificial hip joint: Secondary | ICD-10-CM | POA: Diagnosis not present

## 2024-02-13 DIAGNOSIS — M25652 Stiffness of left hip, not elsewhere classified: Secondary | ICD-10-CM | POA: Diagnosis not present

## 2024-02-13 DIAGNOSIS — R531 Weakness: Secondary | ICD-10-CM | POA: Diagnosis not present
